# Patient Record
Sex: Male | Born: 1985 | Hispanic: No | Smoking: Current every day smoker
Health system: Southern US, Community
[De-identification: ages and names within clinical notes are randomized; demographics above are authoritative.]

---

## 2015-04-04 ENCOUNTER — Emergency Department (HOSPITAL_COMMUNITY): Payer: Self-pay

## 2015-04-04 ENCOUNTER — Emergency Department (HOSPITAL_COMMUNITY)
Admission: EM | Admit: 2015-04-04 | Discharge: 2015-04-04 | Disposition: A | Discharge status: Home / Self Care | Payer: Self-pay | Attending: Emergency Medicine | Admitting: Emergency Medicine

## 2015-04-04 ENCOUNTER — Encounter (HOSPITAL_COMMUNITY): Payer: Self-pay

## 2015-04-04 DIAGNOSIS — R451 Restlessness and agitation: Secondary | ICD-10-CM

## 2015-04-04 DIAGNOSIS — N50811 Right testicular pain: Secondary | ICD-10-CM

## 2015-04-04 DIAGNOSIS — F10929 Alcohol use, unspecified with intoxication, unspecified: Secondary | ICD-10-CM

## 2015-04-04 DIAGNOSIS — F17200 Nicotine dependence, unspecified, uncomplicated: Secondary | ICD-10-CM | POA: Insufficient documentation

## 2015-04-04 DIAGNOSIS — R079 Chest pain, unspecified: Secondary | ICD-10-CM

## 2015-04-04 DIAGNOSIS — N50819 Testicular pain, unspecified: Secondary | ICD-10-CM | POA: Insufficient documentation

## 2015-04-04 DIAGNOSIS — R000 Tachycardia, unspecified: Secondary | ICD-10-CM | POA: Insufficient documentation

## 2015-04-04 DIAGNOSIS — R319 Hematuria, unspecified: Secondary | ICD-10-CM | POA: Insufficient documentation

## 2015-04-04 DIAGNOSIS — F10120 Alcohol abuse with intoxication, uncomplicated: Secondary | ICD-10-CM | POA: Insufficient documentation

## 2015-04-04 DIAGNOSIS — G8929 Other chronic pain: Secondary | ICD-10-CM

## 2015-04-04 DIAGNOSIS — R300 Dysuria: Secondary | ICD-10-CM | POA: Insufficient documentation

## 2015-04-04 LAB — CBC WITH DIFFERENTIAL/PLATELET
BASOS PCT: 0 %
Basophils Absolute: 0 10*3/uL (ref 0.0–0.1)
EOS PCT: 1 %
Eosinophils Absolute: 0.1 10*3/uL (ref 0.0–0.7)
HCT: 45.6 % (ref 39.0–52.0)
HEMOGLOBIN: 15.4 g/dL (ref 13.0–17.0)
LYMPHS PCT: 32 %
Lymphs Abs: 4.5 10*3/uL — ABNORMAL HIGH (ref 0.7–4.0)
MCH: 31.6 pg (ref 26.0–34.0)
MCHC: 33.8 g/dL (ref 30.0–36.0)
MCV: 93.4 fL (ref 78.0–100.0)
MONOS PCT: 5 %
Monocytes Absolute: 0.7 10*3/uL (ref 0.1–1.0)
NEUTROS PCT: 62 %
Neutro Abs: 8.9 10*3/uL — ABNORMAL HIGH (ref 1.7–7.7)
Platelets: 299 10*3/uL (ref 150–400)
RBC: 4.88 MIL/uL (ref 4.22–5.81)
RDW: 12.3 % (ref 11.5–15.5)
WBC: 14.2 10*3/uL — ABNORMAL HIGH (ref 4.0–10.5)

## 2015-04-04 LAB — URINALYSIS, ROUTINE W REFLEX MICROSCOPIC
Bilirubin Urine: NEGATIVE
Glucose, UA: NEGATIVE mg/dL
Ketones, ur: NEGATIVE mg/dL
LEUKOCYTES UA: NEGATIVE
NITRITE: NEGATIVE
PH: 6 (ref 5.0–8.0)
Protein, ur: NEGATIVE mg/dL
SPECIFIC GRAVITY, URINE: 1.01 (ref 1.005–1.030)

## 2015-04-04 LAB — RAPID URINE DRUG SCREEN, HOSP PERFORMED
AMPHETAMINES: NOT DETECTED
Barbiturates: NOT DETECTED
Benzodiazepines: NOT DETECTED
Cocaine: NOT DETECTED
OPIATES: NOT DETECTED
Tetrahydrocannabinol: NOT DETECTED

## 2015-04-04 LAB — COMPREHENSIVE METABOLIC PANEL
ALK PHOS: 52 U/L (ref 38–126)
ALT: 34 U/L (ref 17–63)
AST: 67 U/L — AB (ref 15–41)
Albumin: 4.7 g/dL (ref 3.5–5.0)
Anion gap: 10 (ref 5–15)
BUN: 16 mg/dL (ref 6–20)
CALCIUM: 9 mg/dL (ref 8.9–10.3)
CO2: 25 mmol/L (ref 22–32)
CREATININE: 1.11 mg/dL (ref 0.61–1.24)
Chloride: 108 mmol/L (ref 101–111)
Glucose, Bld: 96 mg/dL (ref 65–99)
Potassium: 3.4 mmol/L — ABNORMAL LOW (ref 3.5–5.1)
Sodium: 143 mmol/L (ref 135–145)
Total Bilirubin: 0.6 mg/dL (ref 0.3–1.2)
Total Protein: 7.8 g/dL (ref 6.5–8.1)

## 2015-04-04 LAB — I-STAT CHEM 8, ED
BUN: 16 mg/dL (ref 6–20)
CALCIUM ION: 1.12 mmol/L (ref 1.12–1.23)
CREATININE: 1.4 mg/dL — AB (ref 0.61–1.24)
Chloride: 105 mmol/L (ref 101–111)
Glucose, Bld: 97 mg/dL (ref 65–99)
HEMATOCRIT: 50 % (ref 39.0–52.0)
HEMOGLOBIN: 17 g/dL (ref 13.0–17.0)
Potassium: 3.3 mmol/L — ABNORMAL LOW (ref 3.5–5.1)
SODIUM: 146 mmol/L — AB (ref 135–145)
TCO2: 25 mmol/L (ref 0–100)

## 2015-04-04 LAB — URINE MICROSCOPIC-ADD ON: Squamous Epithelial / LPF: NONE SEEN

## 2015-04-04 LAB — CBG MONITORING, ED: Glucose-Capillary: 83 mg/dL (ref 65–99)

## 2015-04-04 LAB — I-STAT CG4 LACTIC ACID, ED: Lactic Acid, Venous: 2.1 mmol/L (ref 0.5–2.0)

## 2015-04-04 LAB — ETHANOL: ALCOHOL ETHYL (B): 250 mg/dL — AB (ref <5)

## 2015-04-04 MED ORDER — NALOXONE HCL 2 MG/2ML IJ SOSY
2.0000 mg | PREFILLED_SYRINGE | Freq: Once | INTRAMUSCULAR | Status: AC
  Administered 2015-04-04: 2 mg via INTRAVENOUS

## 2015-04-04 MED ORDER — NALOXONE HCL 0.4 MG/ML IJ SOLN: 0.4000 mg | Freq: Once | INTRAMUSCULAR | Status: DC

## 2015-04-04 MED ORDER — HALOPERIDOL LACTATE 5 MG/ML IJ SOLN
5.0000 mg | Freq: Once | INTRAMUSCULAR | Status: DC
  Filled 2015-04-04: qty 1

## 2015-04-04 MED ORDER — SODIUM CHLORIDE 0.9 % IV BOLUS (SEPSIS)
2000.0000 mL | Freq: Once | INTRAVENOUS | Status: AC
  Administered 2015-04-04: 2000 mL via INTRAVENOUS

## 2015-04-04 NOTE — ED Notes (Signed)
Pt became unresponsive with EMS enroute.  Pt started having "guppy-type breathing" and lost consciousness.  Pt did not respond to sternal rubb.  Pt given 2mg  of Narcan per MD Fayrene FearingJames' order.  Pt now arousing and alert but intoxicated.

## 2015-04-04 NOTE — ED Notes (Signed)
[employer] called for pt.  Pt was given d/c papers which are in spanish and shown where lobby is.  RN and pt discussed D/C papers and pt did not have any questions per spanish interpreter Multimedia programmer([employer]).

## 2015-04-04 NOTE — ED Notes (Signed)
Pt returned from CT and texting and talking on his phone

## 2015-04-04 NOTE — Discharge Instructions (Signed)
Seguimiento con un urlogo para la evaluacin posterior de su dolor de testculo. Tome ibuprofeno para el dolor segn sea necesario. No beba cantidades excesivas de alcohol.  Follow up with a urologist for further evaluation of your testicle pain. Take ibuprofen for pain as needed. Do not drink excessive amounts of alcohol.  Intoxicacin alcohlica (Alcohol Intoxication) La intoxicacin alcohlica se produce cuando la cantidad de alcohol que se ha consumido daa la capacidad de funcionamiento mental y fsico. El alcohol deteriora directamente la actividad qumica normal del cerebro. Beber grandes cantidades de alcohol puede conducir a Building control surveyor funcionamiento mental y en el comportamiento, y puede causar muchos efectos fsicos que pueden ser perjudiciales.  La intoxicacin alcohlica puede variar en gravedad desde leve hasta muy grave. Hay varios factores que pueden afectar el nivel de intoxicacin que se produce, como la edad de la persona, el sexo, el peso, la frecuencia de consumo de alcohol, y la presencia de otras enfermedades mdicas (como diabetes, convulsiones o enfermedades del corazn). Los niveles peligrosos de intoxicacin por alcohol pueden ocurrir [employer] personas beben grandes cantidades de alcohol en un corto periodo de tiempo ([place]). El alcohol tambin puede ser especialmente peligroso cuando se combina con ciertos medicamentos recetados o drogas "recreativas". SIGNOS Y SNTOMAS Algunos de los signos y sntomas comunes de intoxicacin leve por alcohol incluyen:  Prdida de la coordinacin.  Cambios en el estado de nimo y la conducta.  Incapacidad para razonar.  Hablar arrastrando las palabras. A medida que la intoxicacin por alcohol avanza a niveles ms graves, Zenaida Niece a Research officer, trade union otros signos y sntomas. Estos pueden ser:  Vmitos.  Confusin y alteracin de Scientist, clinical (histocompatibility and immunogenetics).  Disminucin de Engineer, manufacturing systems.  Convulsiones.  Prdida de la  conciencia. DIAGNSTICO  El mdico le har una historia clnica y un examen fsico. Se le preguntar acerca de la cantidad y el tipo de alcohol que ha consumido. Se le realizarn anlisis de sangre para medir la concentracin de alcohol en sangre. En muchos lugares, el nivel de alcohol en la sangre debe ser inferior a 80 mg / dL (1,61%) para poder conducir legalmente. Sin embargo, hay muchos efectos peligrosos del alcohol que pueden ocurrir con niveles mucho ms bajos.  TRATAMIENTO  Las personas con intoxicacin por alcohol a menudo no requieren TEFL teacher. La mayor parte de los efectos de la intoxicacin por alcohol son temporales, y desaparecen a medida que el alcohol abandona el cuerpo de forma natural. El profesional controlar su estado hasta que est lo suficientemente estable como para volver a casa. A veces se administran lquidos por va intravenosa para ayudar a evitar la deshidratacin.  INSTRUCCIONES PARA EL CUIDADO EN EL HOGAR  No conduzca vehculos despus de beber alcohol.  Mantngase hidratado. Beba gran cantidad de lquido para mantener la orina de tono claro o color amarillo plido. Evite la cafena.   Tome slo medicamentos de venta libre o recetados, segn las indicaciones del mdico.  SOLICITE ATENCIN MDICA SI:   Tiene vmitos persistentes.   No mejora luego de [employer].  Se intoxica con alcohol con frecuencia. El mdico podr ayudarlo a decidir si debe consultar a un terapeuta especializado en el abuso de sustancias. SOLICITE ATENCIN MDICA DE INMEDIATO SI:   Se siente vacilante o tembloroso cuando trata de abandonar el hbito.   Comienza a temblar de manera incontrolable (convulsiones).   Vomita sangre. Puede ser sangre de color rojo brillante o similar al sedimento del caf negro.   Observa sangre en la materia fecal.  Puede ser de color rojo brillante o de aspecto alquitranado, con olor ftido.   Se siente mareado o se desmaya.  ASEGRESE DE QUE:    Comprende estas instrucciones.  Controlar su afeccin.  Recibir ayuda de inmediato si no mejora o si empeora.   Esta informacin no tiene Theme park managercomo fin reemplazar el consejo del mdico. Asegrese de hacerle al mdico cualquier pregunta que tenga.   Document Released: 03/17/2005 Document Revised: 11/17/2012 Elsevier Interactive Patient Education [employer]2016 Elsevier Inc.

## 2015-04-04 NOTE — ED Notes (Signed)
Bed: RESA Expected date:  Expected time:  Means of arrival:  Comments: Hematuria, etoh
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
## 2015-04-04 NOTE — ED Notes (Signed)
Pt called EMS complaining of hematuria and etoh, upon arrival by EMS he went unresponsive in the ambulance, pt placed on non-rebreather and given narcan, pt speaks very little [place] but states he understands [place]. Pt responds to the narcan and is responding at this time.
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
## 2015-04-05 LAB — URINE CULTURE: Culture: NO GROWTH

## 2016-05-29 IMAGING — CR DG CHEST 1V
1 series · 1 of 1 positions shown · non-contrast
Comparison: None.

CLINICAL DATA: Acute onset of generalized chest pain and hematuria.
Syncope. Initial encounter.

EXAM:
CHEST 1 VIEW

[x chest ap]
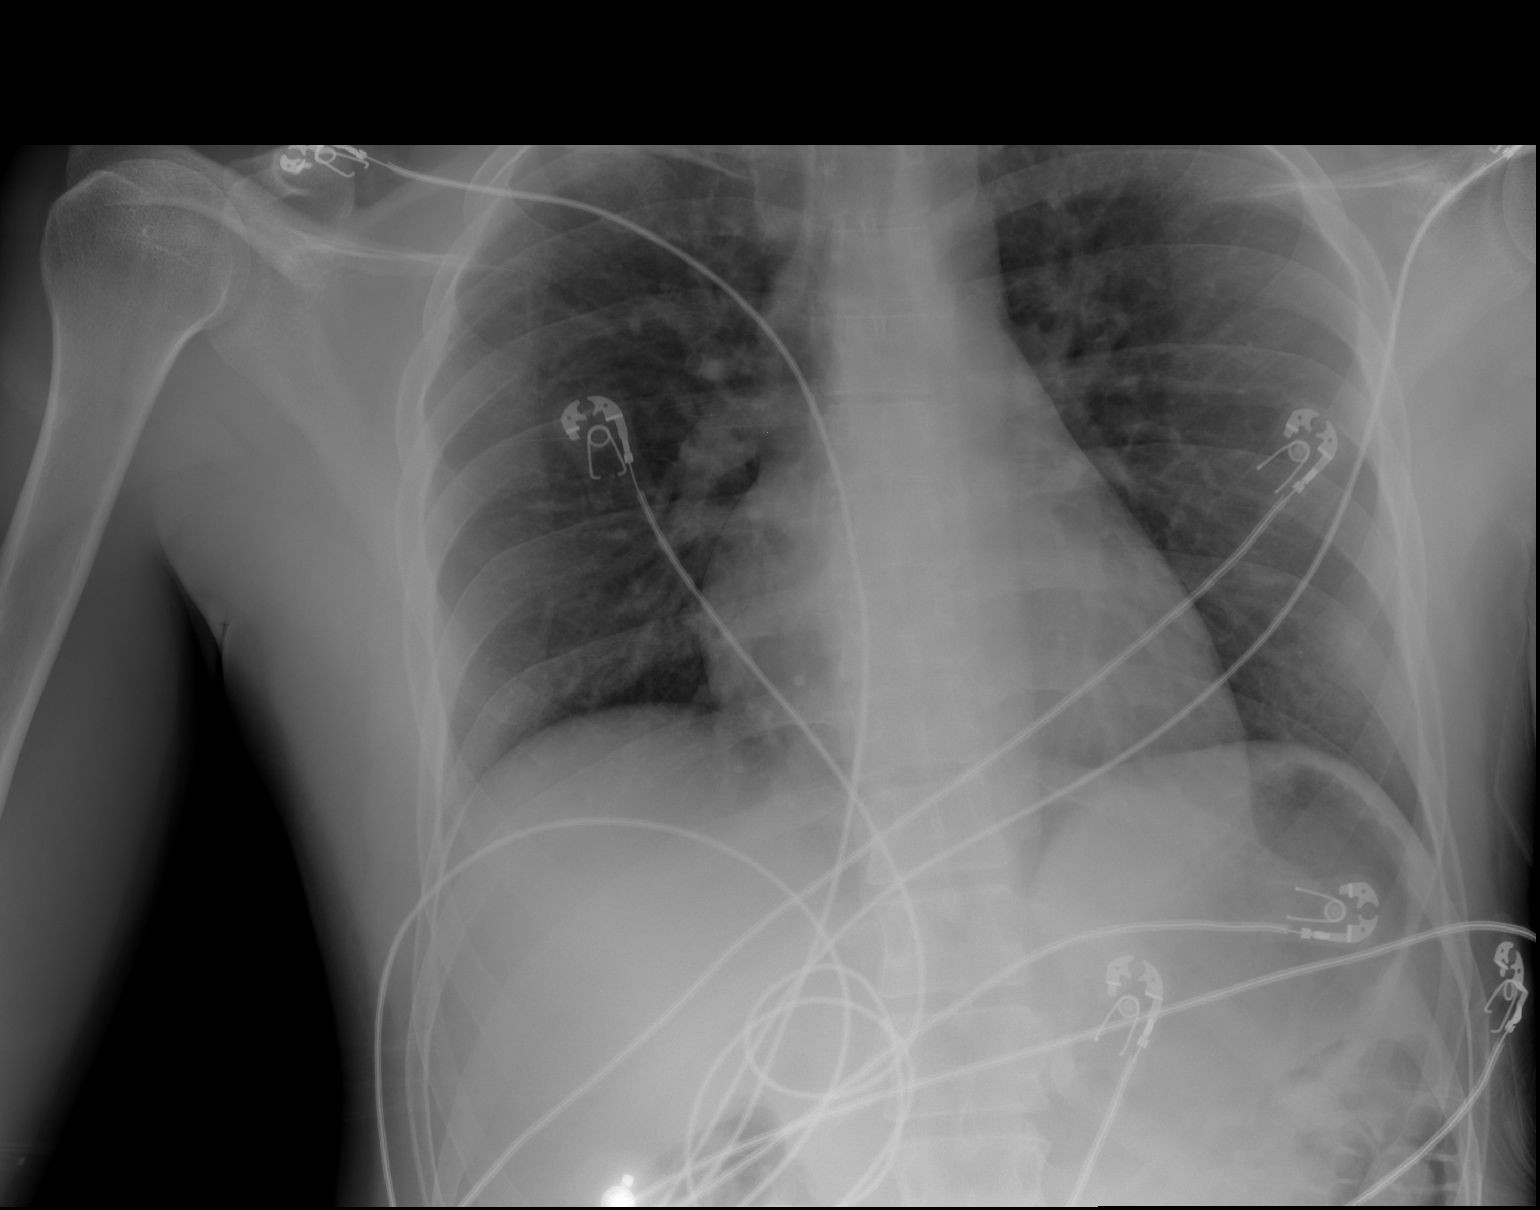

[1 of 1 positions shown; findings below may reference images not displayed]

FINDINGS: The lungs are well-aerated and clear. There is no evidence of focal
opacification, pleural effusion or pneumothorax.

The cardiomediastinal silhouette is within normal limits. No acute
osseous abnormalities are seen.
IMPRESSION: No acute cardiopulmonary process seen.

## 2017-10-09 IMAGING — US US SCROTUM
1 series · 14 of 25 positions shown · non-contrast
Comparison: None.

CLINICAL DATA: Acute onset of right testicular pain. Initial
encounter.

EXAM:
SCROTAL ULTRASOUND
DOPPLER ULTRASOUND OF THE TESTICLES
TECHNIQUE: Complete ultrasound examination of the testicles, epididymis, and
other scrotal structures was performed. Color and spectral Doppler
ultrasound were also utilized to evaluate blood flow to the
testicles.

[Series 1: us scrotum · 0.07mm/px · 14 of 36 slices shown]
[im 1/36]
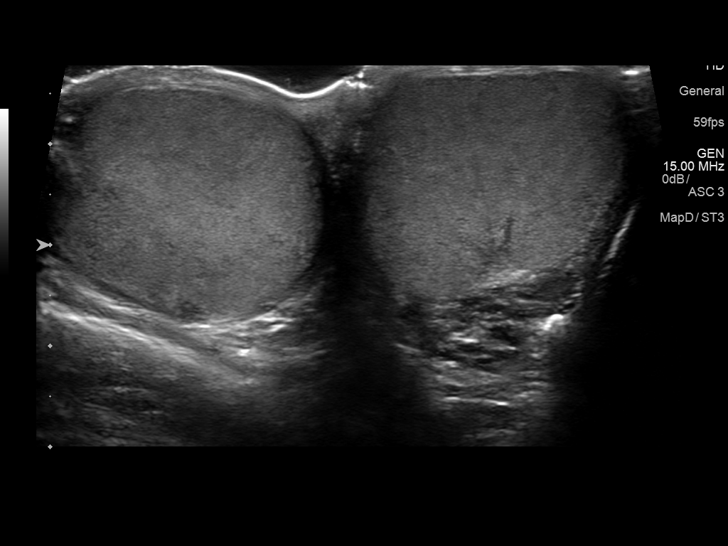
[im 3/36]
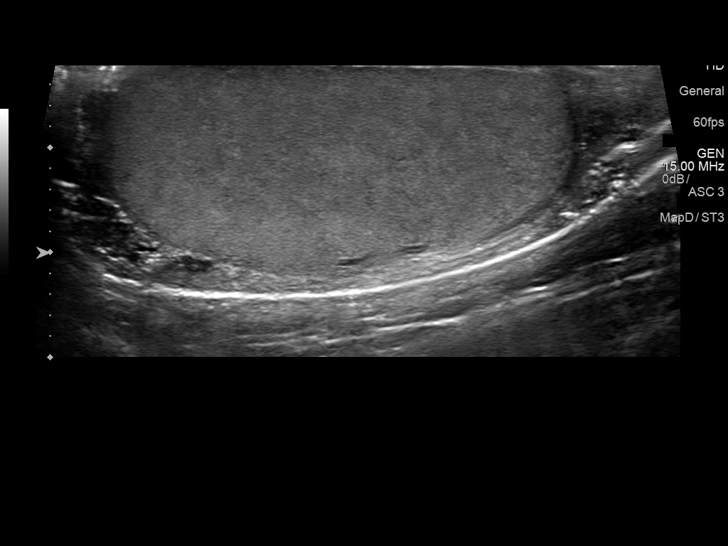
[im 6/36]
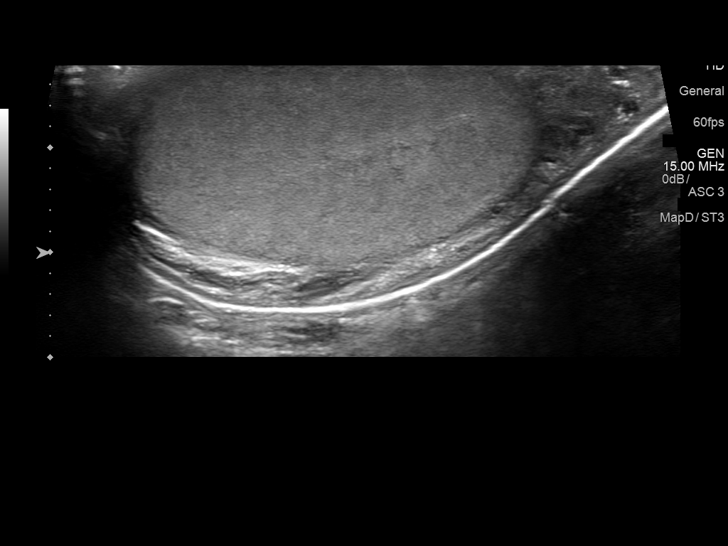
[im 9/36]
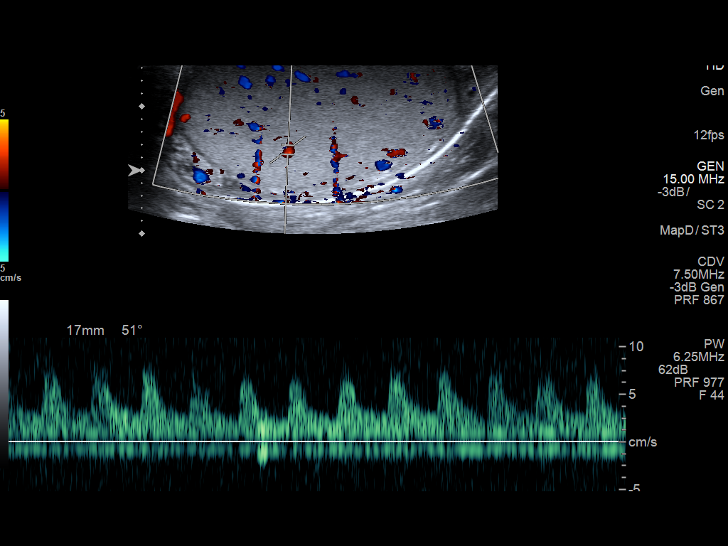
[im 12/36]
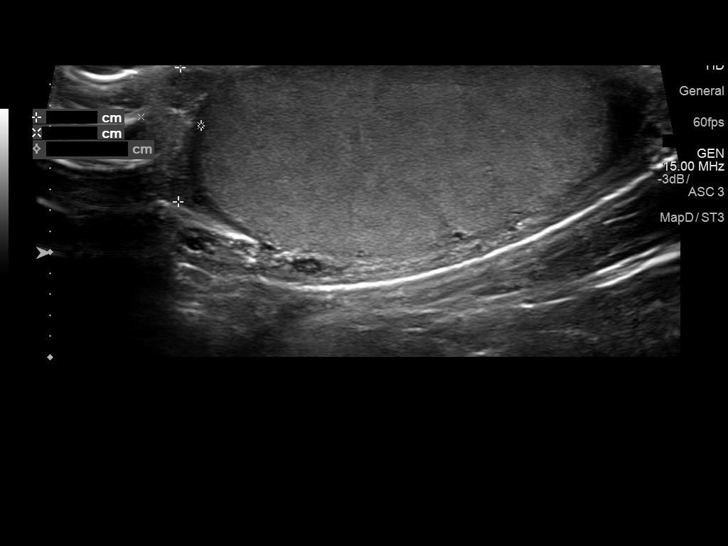
[im 14/36]
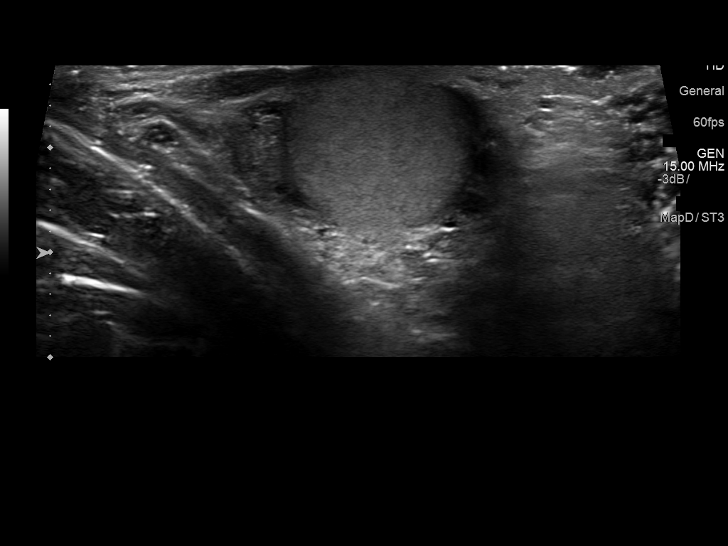
[im 17/36]
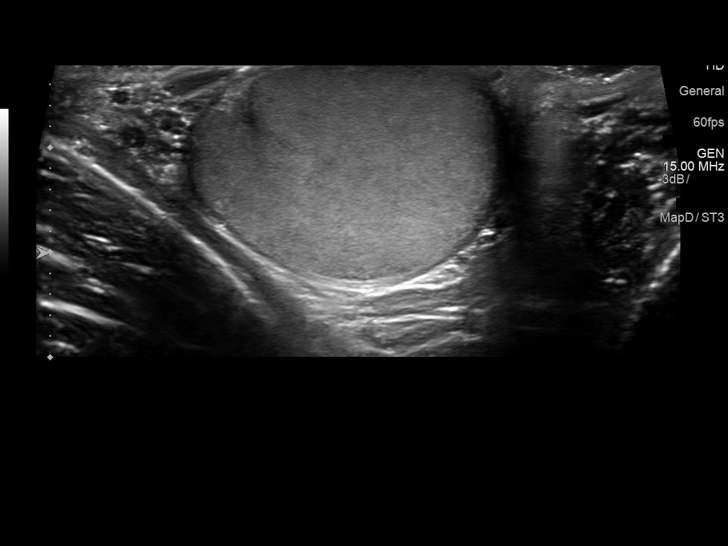
[im 19/36]
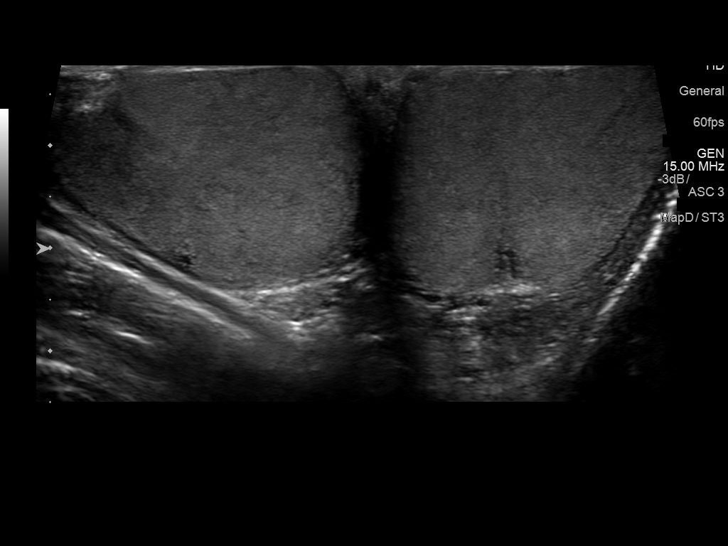
[im 22/36]
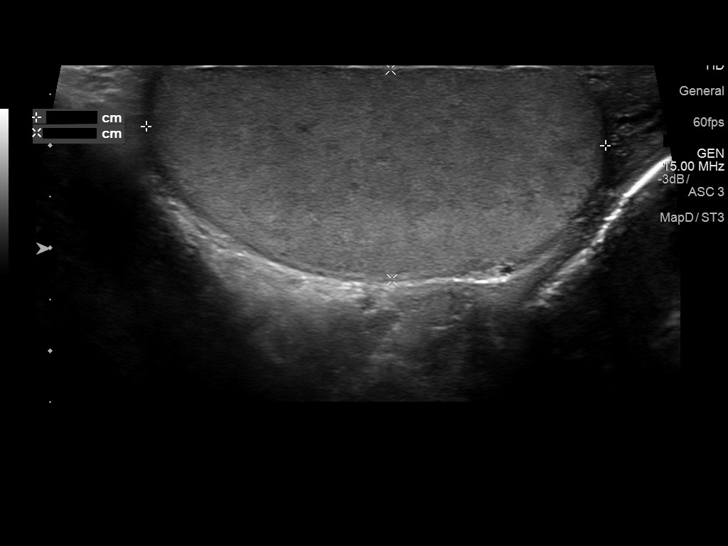
[im 24/36]
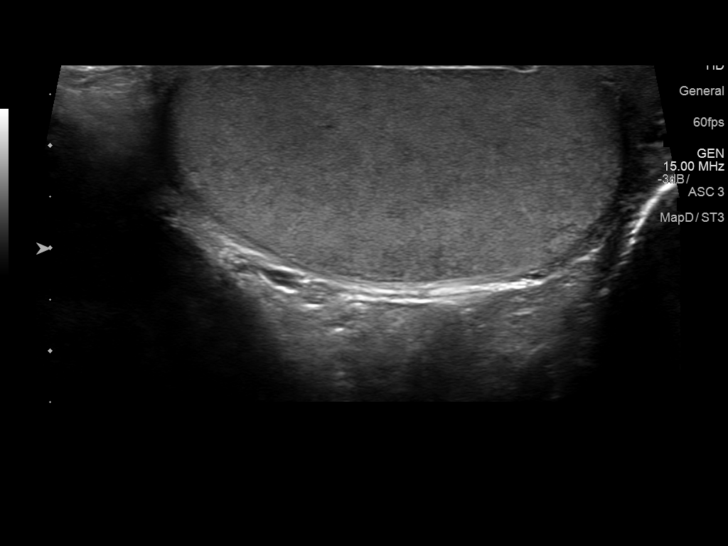
[im 27/36]
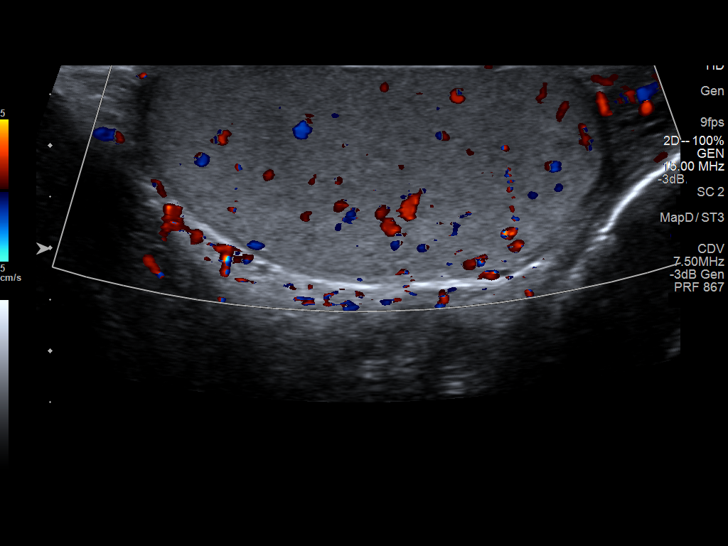
[im 30/36]
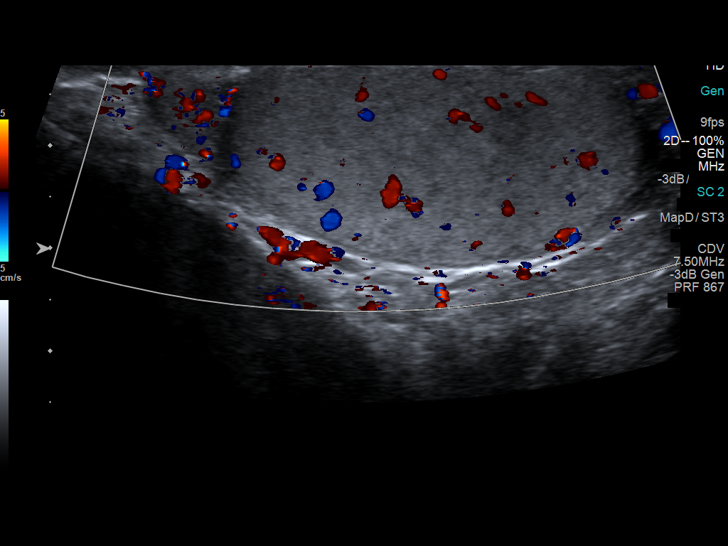
[im 33/36]
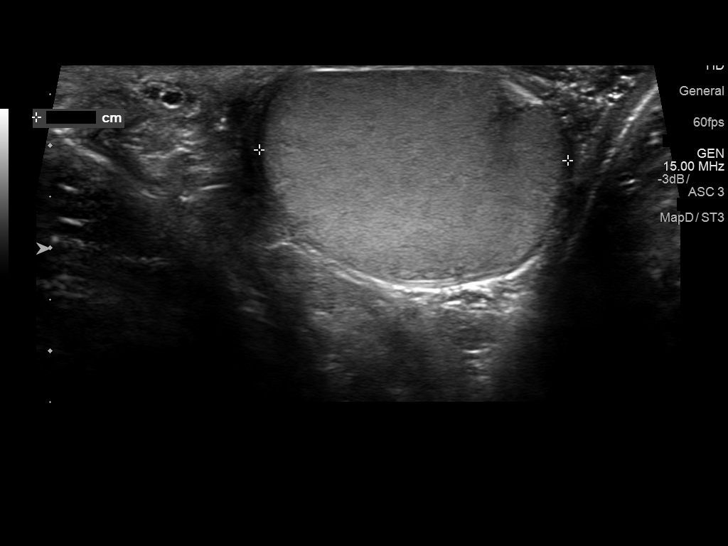
[im 36/36]
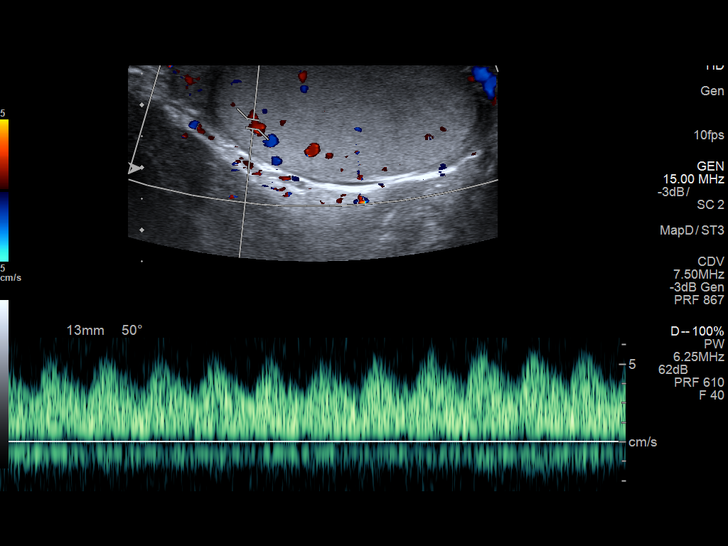

[14 of 25 positions shown; findings below may reference images not displayed]

FINDINGS: Right testicle

Measurements: 4.4 x 1.9 x 2.7 cm. No mass or microlithiasis
visualized.

Left testicle

Measurements: 4.5 x 2.1 x 3.0 cm. No mass or microlithiasis
visualized.

Right epididymis:  Normal in size and appearance.

Left epididymis:  Normal in size and appearance.

Hydrocele:  None visualized.

Varicocele:  None visualized.

Pulsed Doppler interrogation of both testes demonstrates normal low
resistance arterial and venous waveforms bilaterally.
IMPRESSION: No evidence of testicular torsion.  Unremarkable scrotal ultrasound.
